# Patient Record
Sex: Male | Born: 1960
Health system: Southern US, Community
[De-identification: ages and names within clinical notes are randomized; demographics above are authoritative.]

## PROBLEM LIST (undated history)

## (undated) DIAGNOSIS — M109 Gout, unspecified: Secondary | ICD-10-CM

## (undated) HISTORY — DX: Gout, unspecified: M10.9

## (undated) HISTORY — PX: WISDOM TOOTH EXTRACTION: SHX21

## (undated) HISTORY — PX: HERNIA REPAIR: SHX51

---

## 2010-08-17 ENCOUNTER — Inpatient Hospital Stay (INDEPENDENT_AMBULATORY_CARE_PROVIDER_SITE_OTHER)
Admission: RE | Admit: 2010-08-17 | Discharge: 2010-08-17 | Disposition: A | Discharge status: Home / Self Care | Payer: BC Managed Care – PPO | Source: Ambulatory Visit | Attending: Emergency Medicine | Admitting: Emergency Medicine

## 2010-08-17 ENCOUNTER — Encounter: Payer: Self-pay | Admitting: Emergency Medicine

## 2010-08-17 DIAGNOSIS — R079 Chest pain, unspecified: Secondary | ICD-10-CM | POA: Insufficient documentation

## 2011-02-17 NOTE — Progress Notes (Signed)
Summary: RIB PAIN(rm4)   Vital Signs:  Patient Profile:   50 Years Old Male CC:      rib pain Height:     70 inches Weight:      148.8 pounds O2 Sat:      100 % O2 treatment:    Room Air Temp:     97.9 degrees F oral Pulse rate:   76 / minute Resp:     20 per minute BP sitting:   126 / 79  (left arm) Cuff size:   regular  Vitals Entered By: Linton Flemings RN (August 17, 2010 9:32 AM)                  Updated Prior Medication List: No Medications Current Allergies: No known allergies History of Present Illness History from: patient Chief Complaint: rib pain History of Present Illness: L sided rib pain for the last 3 weeks, worse with deep breaths and coughing.  His wife had pneumonia and he also got sick, and was Rx Zpak.  His URI symptoms have completely resolved but still with lower left side pain.  No CP, SOB,  DOE, orthopnea, dyspea, sweating, radiation of pain, dizziness, blurry vision.  REVIEW OF SYSTEMS Constitutional Symptoms      Denies fever, chills, night sweats, weight loss, weight gain, and fatigue.  Eyes       Denies change in vision, eye pain, eye discharge, glasses, contact lenses, and eye surgery. Ear/Nose/Throat/Mouth       Denies hearing loss/aids, change in hearing, ear pain, ear discharge, dizziness, frequent runny nose, frequent nose bleeds, sinus problems, sore throat, hoarseness, and tooth pain or bleeding.  Respiratory       Denies dry cough, productive cough, wheezing, shortness of breath, asthma, bronchitis, and emphysema/COPD.  Cardiovascular       Denies murmurs, chest pain, and tires easily with exhertion.    Gastrointestinal       Denies stomach pain, nausea/vomiting, diarrhea, constipation, blood in bowel movements, and indigestion. Genitourniary       Denies painful urination, kidney stones, and loss of urinary control. Neurological       Denies paralysis, seizures, and fainting/blackouts. Musculoskeletal       Denies muscle pain,  joint pain, joint stiffness, decreased range of motion, redness, swelling, muscle weakness, and gout.  Skin       Denies bruising, unusual mles/lumps or sores, and hair/skin or nail changes.  Psych       Denies mood changes, temper/anger issues, anxiety/stress, speech problems, depression, and sleep problems. Other Comments: left rib pain, states he had a virus a month ago and had alot of coughing. now having left rib pain and unable to take deep breathe at times   Past History:  Past Medical History: Unremarkable  Past Surgical History: double hernia  Family History: mother-CA Family History Diabetes 1st degree relative  Social History: smoke- yes alcohol-yes rec. drugs-no Physical Exam General appearance: well developed, well nourished, no acute distress Chest/Lungs: no rales, wheezes, or rhonchi bilateral, breath sounds equal without effort Heart: regular rate and  rhythm, no murmur MSE: oriented to time, place, and person AP and Lat squeezing not painful.  +TTP point tenderness lateral aspect chest wall around 10th rib.  No bruising or rash seen.   Assessment New Problems: RIB PAIN (ICD-786.50) FAMILY HISTORY DIABETES 1ST DEGREE RELATIVE (ICD-V18.0)   Plan New Orders: New Patient Level II [99202] Planning Comments:   Rib pain likely costochondritis, although also discussed  possibiltity of stress fracture of rib.  I think he should give it a little more time (1-2 more weeks), avoid coughing, consider mild NSAIDs OTC.  If any worsening, go to ER.  If not improving, need to consider Xray and rib belt.  Avoid strenuous pushing, pulling, lifting.  No meds given today.   The patient and/or caregiver has been counseled thoroughly with regard to medications prescribed including dosage, schedule, interactions, rationale for use, and possible side effects and they verbalize understanding.  Diagnoses and expected course of recovery discussed and will return if not improved as  expected or if the condition worsens. Patient and/or caregiver verbalized understanding.   Orders Added: 1)  New Patient Level II [99202]

## 2012-09-16 ENCOUNTER — Emergency Department (INDEPENDENT_AMBULATORY_CARE_PROVIDER_SITE_OTHER)
Admission: EM | Admit: 2012-09-16 | Discharge: 2012-09-16 | Disposition: A | Discharge status: Home / Self Care | Payer: BC Managed Care – PPO | Source: Home / Self Care | Attending: Family Medicine | Admitting: Family Medicine

## 2012-09-16 ENCOUNTER — Emergency Department (INDEPENDENT_AMBULATORY_CARE_PROVIDER_SITE_OTHER): Payer: BC Managed Care – PPO

## 2012-09-16 ENCOUNTER — Encounter: Payer: Self-pay | Admitting: *Deleted

## 2012-09-16 ENCOUNTER — Other Ambulatory Visit: Payer: Self-pay | Admitting: Family Medicine

## 2012-09-16 DIAGNOSIS — R05 Cough: Secondary | ICD-10-CM

## 2012-09-16 DIAGNOSIS — R059 Cough, unspecified: Secondary | ICD-10-CM

## 2012-09-16 DIAGNOSIS — J40 Bronchitis, not specified as acute or chronic: Secondary | ICD-10-CM

## 2012-09-16 DIAGNOSIS — R509 Fever, unspecified: Secondary | ICD-10-CM

## 2012-09-16 DIAGNOSIS — J069 Acute upper respiratory infection, unspecified: Secondary | ICD-10-CM

## 2012-09-16 LAB — POCT CBC W AUTO DIFF (K'VILLE URGENT CARE)

## 2012-09-16 MED ORDER — DOXYCYCLINE HYCLATE 100 MG PO CAPS: 100.0000 mg | ORAL_CAPSULE | Freq: Two times a day (BID) | ORAL | Status: AC

## 2012-09-16 NOTE — ED Notes (Signed)
Bobby Patterson c/o fever, body aches and dry cough x 3am today. Taken advil @ 3am.

## 2012-09-16 NOTE — ED Provider Notes (Signed)
History    CSN: [id number] Arrival date & time 09/16/12  1646  First MD Initiated Contact with Patient 09/16/12 1655     Chief Complaint  Patient presents with  . Fever  . Generalized Body Aches    HPI  URI Symptoms Onset: 1 day  Description: fever, generalized malaise, cough  Modifying factors:  1/4 PPD smoker   Symptoms Nasal discharge: yes Fever: yes Sore throat: n Cough: yes Wheezing: no Ear pain: no GI symptoms: no Sick contacts: unsure  Red Flags  Stiff neck: no Dyspnea: no Rash: no Swallowing difficulty: no  Sinusitis Risk Factors Headache/face pain: no Double sickening: no tooth pain: no  Allergy Risk Factors Sneezing: no Itchy scratchy throat: no Seasonal symptoms: no  Flu Risk Factors Headache: yes muscle aches: yes severe fatigue: mild    History reviewed. No pertinent past medical history. Past Surgical History  Procedure Laterality Date  . Hernia repair     Family History  Problem Relation Age of Onset  . Cancer Mother     ovarian  . Cancer Father     colon   History  Substance Use Topics  . Smoking status: Current Every Day Smoker -- 0.25 packs/day for 10 years    Types: Cigarettes  . Smokeless tobacco: Never Used  . Alcohol Use: Yes    Review of Systems  All other systems reviewed and are negative.    Allergies  Review of patient's allergies indicates no known allergies.  Home Medications  No current outpatient prescriptions on file. BP 117/86  Pulse 79  Temp(Src) 100.8 F (38.2 C) (Oral)  Resp 16  Ht 5\' 10"  (1.778 m)  Wt 150 lb (68.04 kg)  BMI 21.52 kg/m2  SpO2 98% Physical Exam  Constitutional: He appears well-developed and well-nourished.  HENT:  Head: Normocephalic and atraumatic.  Right Ear: External ear normal.  Left Ear: External ear normal.  +nasal erythema, rhinorrhea bilaterally, + post oropharyngeal erythema    Eyes: Conjunctivae are normal. Pupils are equal, round, and reactive to light.   Neck: Normal range of motion. Neck supple.  Cardiovascular: Normal rate, regular rhythm and normal heart sounds.   Pulmonary/Chest: Effort normal and breath sounds normal. No respiratory distress. He has no wheezes. He has no rales.  Abdominal: Soft.  Musculoskeletal: Normal range of motion.  Neurological: He is alert.  Skin: Skin is warm.    ED Course  Procedures (including critical care time) Labs Reviewed  INFLUENZA PANEL BY PCR  ROCKY MTN SPOTTED FVR AB, IGG-BLOOD  ROCKY MTN SPOTTED FVR AB, IGM-BLOOD  B. BURGDORFI ANTIBODIES  POCT CBC W AUTO DIFF ([facility])   Dg Chest 2 View  09/16/2012   *RADIOLOGY REPORT*  Clinical Data: fever, cough  CHEST - 2 VIEW  Comparison: None.  Findings: The cardiac and mediastinal silhouettes are within normal limits.  The lungs are well inflated.  No airspace consolidation to suggest acute infectious pneumonitis is identified. There is no pleural effusion or pulmonary edema.  No pneumothorax.  No acute osseous abnormality.  IMPRESSION: No acute cardiopulmonary process.   Original Report Authenticated By: Rise Mu, M.D.   1. URI (upper respiratory infection)   2. Bronchitis     MDM  Likely viral process. Given seasonal presentation will check patient for Lyme disease, RMSF, as well as flu. Patient given prophylactic prescription for doxycycline for upper respiratory as well as RMSF coverage. Discuss infectious and general red flags. Followup as needed.    The patient  and/or caregiver has been counseled thoroughly with regard to treatment plan and/or medications prescribed including dosage, schedule, interactions, rationale for use, and possible side effects and they verbalize understanding. Diagnoses and expected course of recovery discussed and will return if not improved as expected or if the condition worsens. Patient and/or caregiver verbalized understanding.       Doree Albee, MD 09/16/12 [phone]

## 2012-09-19 ENCOUNTER — Telehealth: Payer: Self-pay | Admitting: Emergency Medicine

## 2012-09-19 LAB — ROCKY MTN SPOTTED FVR AB, IGG-BLOOD: RMSF IgG: 0.18 IV

## 2012-09-19 LAB — INFLUENZA A & B PCR: Influenza B: NOT DETECTED

## 2012-09-20 LAB — B. BURGDORFI ANTIBODIES: B burgdorferi Ab IgG+IgM: 0.24 {ISR}

## 2014-05-16 ENCOUNTER — Emergency Department (INDEPENDENT_AMBULATORY_CARE_PROVIDER_SITE_OTHER)
Admission: EM | Admit: 2014-05-16 | Discharge: 2014-05-16 | Disposition: A | Discharge status: Home / Self Care | Payer: BLUE CROSS/BLUE SHIELD | Source: Home / Self Care | Attending: Emergency Medicine | Admitting: Emergency Medicine

## 2014-05-16 ENCOUNTER — Encounter: Payer: Self-pay | Admitting: *Deleted

## 2014-05-16 DIAGNOSIS — J111 Influenza due to unidentified influenza virus with other respiratory manifestations: Secondary | ICD-10-CM

## 2014-05-16 DIAGNOSIS — J1189 Influenza due to unidentified influenza virus with other manifestations: Secondary | ICD-10-CM

## 2014-05-16 MED ORDER — OSELTAMIVIR PHOSPHATE 75 MG PO CAPS: 75.0000 mg | ORAL_CAPSULE | Freq: Two times a day (BID) | ORAL | Status: DC

## 2014-05-16 NOTE — ED Provider Notes (Signed)
CSN: [id number]     Arrival date & time 05/16/14  0805 History   First MD Initiated Contact with Patient 05/16/14 [phone]     Chief Complaint  Patient presents with  . Fever  . Chills   (Consider location/radiation/quality/duration/timing/severity/associated sxs/prior Treatment) Patient is a 54 y.o. male presenting with fever. The history is provided by the patient. No language interpreter was used.  Fever Max temp prior to arrival:  100 Temp source:  Subjective Duration:  2 days Timing:  Constant Progression:  Worsening Chronicity:  New Relieved by:  Nothing Worsened by:  Nothing tried Ineffective treatments:  None tried Associated symptoms: cough, myalgias, rhinorrhea and sore throat   Risk factors: no sick contacts     History reviewed. No pertinent past medical history. Past Surgical History  Procedure Laterality Date  . Hernia repair    . Wisdom tooth extraction     Family History  Problem Relation Age of Onset  . Cancer Mother     ovarian  . Cancer Father     colon   History  Substance Use Topics  . Smoking status: Current Every Day Smoker -- 0.25 packs/day for 10 years    Types: Cigarettes  . Smokeless tobacco: Never Used  . Alcohol Use: Yes    Review of Systems  Constitutional: Positive for fever.  HENT: Positive for rhinorrhea and sore throat.   Respiratory: Positive for cough.   Musculoskeletal: Positive for myalgias.  All other systems reviewed and are negative.   Allergies  Review of patient's allergies indicates no known allergies.  Home Medications   Prior to Admission medications   Medication Sig Start Date End Date Taking? Authorizing Provider  oseltamivir (TAMIFLU) 75 MG capsule Take 1 capsule (75 mg total) by mouth every 12 (twelve) hours. 05/16/14   Elson AreasLeslie K Keats Kingry, PA-C   BP 128/75 mmHg  Pulse 86  Temp(Src) 99.4 F (37.4 C) (Oral)  Resp 18  Ht 5\' 10"  (1.778 m)  Wt 150 lb (68.04 kg)  BMI 21.52 kg/m2  SpO2 98% Physical Exam   Constitutional: He is oriented to person, place, and time. He appears well-developed and well-nourished.  HENT:  Head: Normocephalic.  Right Ear: External ear normal.  Left Ear: External ear normal.  Nose: Nose normal.  Mouth/Throat: Oropharynx is clear and moist.  Eyes: Conjunctivae and EOM are normal. Pupils are equal, round, and reactive to light.  Neck: Normal range of motion.  Cardiovascular: Normal rate.   Pulmonary/Chest: Effort normal.  Abdominal: Soft. He exhibits no distension.  Musculoskeletal: Normal range of motion.  Neurological: He is alert and oriented to person, place, and time.  Psychiatric: He has a normal mood and affect.  Nursing note and vitals reviewed.   ED Course  Procedures (including critical care time) Labs Review Labs Reviewed - No data to display  Imaging Review No results found.   MDM   1. Influenza with respiratory manifestations    tamiflu ibuprofen AVS   Elson AreasLeslie K Darshay Deupree, PA-C 05/16/14 0839  Elson AreasLeslie K Varnika Butz, PA-C 05/16/14 [phone]

## 2014-05-16 NOTE — Discharge Instructions (Signed)

## 2014-05-16 NOTE — ED Notes (Signed)
Pt c/o chills, fever, sweats, body aches and dry cough x 1 day. He took Theraflu last night at 10pm.

## 2016-03-20 ENCOUNTER — Emergency Department (INDEPENDENT_AMBULATORY_CARE_PROVIDER_SITE_OTHER): Payer: 59

## 2016-03-20 ENCOUNTER — Encounter: Payer: Self-pay | Admitting: *Deleted

## 2016-03-20 ENCOUNTER — Emergency Department (INDEPENDENT_AMBULATORY_CARE_PROVIDER_SITE_OTHER)
Admission: EM | Admit: 2016-03-20 | Discharge: 2016-03-20 | Disposition: A | Discharge status: Home / Self Care | Payer: 59 | Source: Home / Self Care | Attending: Family Medicine | Admitting: Family Medicine

## 2016-03-20 ENCOUNTER — Telehealth (INDEPENDENT_AMBULATORY_CARE_PROVIDER_SITE_OTHER): Payer: 59 | Admitting: Emergency Medicine

## 2016-03-20 DIAGNOSIS — K314 Gastric diverticulum: Secondary | ICD-10-CM | POA: Diagnosis not present

## 2016-03-20 DIAGNOSIS — R6881 Early satiety: Secondary | ICD-10-CM

## 2016-03-20 DIAGNOSIS — K760 Fatty (change of) liver, not elsewhere classified: Secondary | ICD-10-CM | POA: Diagnosis not present

## 2016-03-20 DIAGNOSIS — Z800 Family history of malignant neoplasm of digestive organs: Secondary | ICD-10-CM

## 2016-03-20 DIAGNOSIS — R195 Other fecal abnormalities: Secondary | ICD-10-CM

## 2016-03-20 LAB — POCT CBC W AUTO DIFF (K'VILLE URGENT CARE)

## 2016-03-20 LAB — COMPLETE METABOLIC PANEL WITH GFR
ALT: 20 U/L (ref 9–46)
AST: 19 U/L (ref 10–35)
Albumin: 4.2 g/dL (ref 3.6–5.1)
Alkaline Phosphatase: 38 U/L — ABNORMAL LOW (ref 40–115)
BUN: 13 mg/dL (ref 7–25)
CO2: 23 mmol/L (ref 20–31)
Calcium: 9 mg/dL (ref 8.6–10.3)
Chloride: 102 mmol/L (ref 98–110)
Creat: 0.83 mg/dL (ref 0.70–1.33)
GFR, Est African American: >89 mL/min (ref >=60)
GFR, Est Non African American: >89 mL/min (ref >=60)
Glucose, Bld: 86 mg/dL (ref 65–99)
Potassium: 4.4 mmol/L (ref 3.5–5.3)
Sodium: 137 mmol/L (ref 135–146)
Total Bilirubin: 0.6 mg/dL (ref 0.2–1.2)
Total Protein: 6.4 g/dL (ref 6.1–8.1)

## 2016-03-20 MED ORDER — IOPAMIDOL (ISOVUE-300) INJECTION 61%
100.0000 mL | Freq: Once | INTRAVENOUS | Status: AC | PRN
  Administered 2016-03-20: 100 mL via INTRAVENOUS

## 2016-03-20 NOTE — ED Provider Notes (Signed)
CSN: [id number]     Arrival date & time 03/20/16  0854 History   First MD Initiated Contact with Patient 03/20/16 [phone]     Chief Complaint  Patient presents with  . GI Problem   (Consider location/radiation/quality/duration/timing/severity/associated sxs/prior Treatment) HPI  Bobby Patterson is a 56 y.o. male presenting to UC with c/o early satiety, excessive fullness and bloating after eating, and change in bowel movements as his stools have been soft and stringy/thin since Thanksgiving about 5-6 weeks ago.  He denies change in diet. Denies blood in stool, nausea, vomiting or weight change. Pt is concerned as his father had colon cancer so he does not want to wait any longer to be evaluated, however, he does not have a PCP and was not able to get in until March 2018.  Hx of hernia repair as a young child but no other abdominal surgeries.     History reviewed. No pertinent past medical history. Past Surgical History:  Procedure Laterality Date  . HERNIA REPAIR    . WISDOM TOOTH EXTRACTION     Family History  Problem Relation Age of Onset  . Cancer Mother     ovarian  . Cancer Father     colon   Social History  Substance Use Topics  . Smoking status: Current Every Day Smoker    Packs/day: 0.25    Years: 10.00    Types: Cigarettes  . Smokeless tobacco: Never Used  . Alcohol use Yes    Review of Systems  Constitutional: Positive for appetite change. Negative for chills, fever and unexpected weight change.  HENT: Negative for congestion, ear pain, sore throat, trouble swallowing and voice change.   Respiratory: Negative for cough and shortness of breath.   Cardiovascular: Negative for chest pain and palpitations.  Gastrointestinal: Positive for abdominal pain ( bloating, discomfort) and constipation ( thin stool, don't feel its all out). Negative for blood in stool, diarrhea, nausea and vomiting.  Genitourinary: Negative for dysuria, frequency, hematuria and urgency.   Musculoskeletal: Negative for arthralgias, back pain and myalgias.  Skin: Negative for rash.  All other systems reviewed and are negative.   Allergies  Patient has no known allergies.  Home Medications   Prior to Admission medications   Not on File   Meds Ordered and Administered this Visit  Medications - No data to display  BP 133/82 (BP Location: Left Arm)   Pulse 70   Temp 97.5 F (36.4 C) (Oral)   Wt 154 lb (69.9 kg)   SpO2 99%   BMI 22.10 kg/m  No data found.   Physical Exam  Constitutional: He is oriented to person, place, and time. He appears well-developed and well-nourished. No distress.  HENT:  Head: Normocephalic and atraumatic.  Nose: Nose normal.  Mouth/Throat: Uvula is midline, oropharynx is clear and moist and mucous membranes are normal.  Eyes: EOM are normal.  Neck: Normal range of motion. Neck supple.  Cardiovascular: Normal rate and regular rhythm.   Pulmonary/Chest: Effort normal and breath sounds normal. No respiratory distress. He has no wheezes. He has no rales.  Abdominal: Soft. Bowel sounds are normal. He exhibits no distension and no mass. There is no tenderness. There is no rebound and no guarding. No hernia.  Musculoskeletal: Normal range of motion.  Neurological: He is alert and oriented to person, place, and time.  Skin: Skin is warm and dry. He is not diaphoretic.  Psychiatric: He has a normal mood and affect. His behavior is normal.  Nursing note and vitals reviewed.   Urgent Care Course   Clinical Course     Procedures (including critical care time)  Labs Review Labs Reviewed  COMPLETE METABOLIC PANEL WITH GFR  LIPASE  AMYLASE  POCT CBC W AUTO DIFF ([facility])    Imaging Review Ct Abdomen Pelvis W Contrast  Result Date: 03/20/2016 CLINICAL DATA:  Abdominal pain/pain with defecation. Developing constipation. EXAM: CT ABDOMEN AND PELVIS WITH CONTRAST TECHNIQUE: Multidetector CT imaging of the abdomen and pelvis  was performed using the standard protocol following bolus administration of intravenous contrast. Oral contrast was also administered. CONTRAST:  ISOVUE-300 IOPAMIDOL (ISOVUE-300) INJECTION 61% COMPARISON:  None. FINDINGS: Lower chest: Lung bases are clear. Hepatobiliary: There is a degree of hepatic steatosis. No focal liver lesions are evident. Gallbladder wall is not appreciably thickened. There is no biliary duct dilatation. Pancreas: No pancreatic mass or inflammatory focus. Spleen: No splenic lesions are evident. Adrenals/Urinary Tract: Adrenals appear normal bilaterally. There are several small cysts in each kidney. The largest cyst on the right is in the upper pole region measuring 1.0 x 0.9 cm. The largest cyst on the left is in the lower pole region posteriorly measuring 1.7 x 1.4 cm. There is no hydronephrosis on either side. There is no renal or ureteral calculus on either side. Urinary bladder is midline with wall thickness within normal limits. Stomach/Bowel: There are scattered sigmoid diverticula without diverticulitis. There is stool throughout much of the colon. There is no appreciable bowel wall or mesenteric thickening. No appreciable colonic narrowing is evident by CT. There is no bowel obstruction. No free air or portal venous air. Vascular/Lymphatic: There is no abdominal aortic aneurysm. No vascular lesions are evident. There is no adenopathy in the abdomen or pelvis. Reproductive: Prostate and seminal vesicles appear normal in size and contour. There is minimal calcification in the prostate. There is no pelvic mass or pelvic fluid collection. Other: Appendix appears unremarkable. There is no ascites or abscess in the abdomen or pelvis. Musculoskeletal: There are no blastic or lytic bone lesions. No intramuscular or abdominal wall lesion. IMPRESSION: Scattered sigmoid diverticula without diverticulitis. No bowel obstruction. No abscess. No bowel wall thickening. Note that stool  throughout the colon could easily obscure a colonic lesion. Given patient's symptoms, direct visualization of the colon may well be advisable. Hepatic steatosis.  No focal liver lesions evident. No renal or ureteral calculus.  No hydronephrosis. Appendix normal. Electronically Signed   By: Bretta Bang III M.D.   On: 03/20/2016 10:48     MDM   1. Early satiety   2. Loose stools   3. Family history of colon cancer in father    Pt c/o 5-6 weeks of GI symptoms. Hx of colon cancer in his father. Pt requesting imaging to r/o cancer as he cannot f/u with a PCP until March and he is afraid to wait until then.  CT abd: diverticula w/o diverticulitis. No bowel obstruction or abscess.  Recommend f/u colonoscopy for further evaluation of symptoms and potential colonic lesion.   Primary Care f/u scheduled for Wednesday, 03/26/16 at [facility]  Resource info for [facility] provided to pt to schedule f/u appointment for colonoscopy. Patient verbalized understanding and agreement with treatment plan.     Junius Finner, PA-C 03/20/16 1326

## 2016-03-20 NOTE — ED Triage Notes (Signed)
Patient c/o 5-6 weeks of excessive fullness after eating and change on bowel movement. Stools are thin and long. Denies changes in drinking and eating habits, denies blood in stool or nausea.

## 2016-03-21 LAB — LIPASE: Lipase: 16 U/L (ref 7–60)

## 2016-03-21 LAB — AMYLASE: Amylase: 35 U/L (ref 0–105)

## 2016-03-22 ENCOUNTER — Telehealth: Payer: Self-pay | Admitting: *Deleted

## 2016-03-22 NOTE — Telephone Encounter (Signed)
Callback: LMOM f/u from visit. Call back as needed. Follow up with family med as scheduled.

## 2016-03-26 ENCOUNTER — Ambulatory Visit (INDEPENDENT_AMBULATORY_CARE_PROVIDER_SITE_OTHER): Payer: 59 | Admitting: Physician Assistant

## 2016-03-26 ENCOUNTER — Encounter: Payer: Self-pay | Admitting: Physician Assistant

## 2016-03-26 VITALS — BP 133/83 | HR 69 | Ht 70.0 in | Wt 158.0 lb

## 2016-03-26 DIAGNOSIS — K5900 Constipation, unspecified: Secondary | ICD-10-CM

## 2016-03-26 DIAGNOSIS — Z0000 Encounter for general adult medical examination without abnormal findings: Secondary | ICD-10-CM

## 2016-03-26 DIAGNOSIS — F17200 Nicotine dependence, unspecified, uncomplicated: Secondary | ICD-10-CM | POA: Insufficient documentation

## 2016-03-26 NOTE — Patient Instructions (Signed)

## 2016-03-26 NOTE — Progress Notes (Signed)
HPI:                                                                Bobby Patterson is a 56 y.o. male who presents to [facility]: Primary Care Sports Medicine today to establish care  Patient complains of constipation x 6 weeks. Patient endorses abdominal bloating and early satiety. States his stools are "thinner/stringy." He denies change in diet or medications. He was seen in urgent care on 03/20/14 and had a CT Abd/Pel that showed a stool burden. Colonoscopy was recommended.   Patient is a current smoker with 10 packyear history.  Family hx significant for diabetes, stroke and colon cancer in father, ovarian cancer in mother, and diabetes paternal grandfather.  Influenza declined Tdap declined  Health Maintenance Health Maintenance  Topic Date Due  . Hepatitis C Screening  [birth year]  . HIV Screening  05/26/1977  . TETANUS/TDAP  05/26/1981  . COLONOSCOPY  05/26/2012  . INFLUENZA VACCINE  10/16/2015   Health Habits  Diet: sodas, fried food for lunch, cooks dinner at home, 1 bottle of water per day  Exercise:  ETOH: yes  Tobacco: yes  Drugs: no  Dental Exam: up to date  Eye Exam: up to date  Past Medical History:  Diagnosis Date  . Gout    Past Surgical History:  Procedure Laterality Date  . HERNIA REPAIR    . WISDOM TOOTH EXTRACTION     Social History  Substance Use Topics  . Smoking status: Current Every Day Smoker    Packs/day: 0.25    Years: 10.00    Types: Cigarettes  . Smokeless tobacco: Never Used  . Alcohol use Yes   family history includes Cancer in his father and mother; Diabetes in his father and paternal grandfather; Stroke in his father.  Review of Systems  Constitutional: Negative for chills, fever, malaise/fatigue and weight loss.  HENT: Negative.   Eyes: Positive for blurred vision (wears corrective lenses).  Respiratory: Negative.   Cardiovascular: Negative.   Gastrointestinal: Positive for constipation. Negative  for abdominal pain, blood in stool and nausea.       Early satiety, bloating  Genitourinary: Negative.   Musculoskeletal: Negative.   Skin: Negative for rash.  Neurological: Negative.   Psychiatric/Behavioral: Negative.      Medications: No current outpatient prescriptions on file.   No current facility-administered medications for this visit.    No Known Allergies     Objective:  BP 133/83   Pulse 69   Ht 5\' 10"  (1.778 m)   Wt 158 lb (71.7 kg)   BMI 22.67 kg/m  Gen: well-groomed, cooperative, not ill-appearing, no distress HEENT: normal conjunctiva, TM's clear, moderate amount of cerumen in left canal, oropharynx clear, moist mucus membranes, no thyromegaly or tenderness Lungs: Normal work of breathing, clear to auscultation bilaterally Heart: Normal rate, regular rhythm, s1 and s2 distinct, no murmurs, clicks or rubs appreciated on this exam, no carotid bruit Abd: Soft. Nondistended, Nontender Neuro: alert and oriented x 3, EOM's intact, PERRLA, DTR's intact MSK: strength 5/5 and symmetric, normal gait Extremities: distal pulses intact, no peripheral edema Skin: warm and dry, no rashes or lesions on exposed skin Psych: normal affect, pleasant mood, normal speech and thought content   No results found  for this or any previous visit (from the past 72 hour(s)). No results found.    Assessment and Plan: 56 y.o. male with   1. Encounter for preventative adult health care examination - Hemoglobin A1c - Lipid panel - Hepatitis C antibody - CBC  2. Constipation, unspecified constipation type - Colace and Miralax daily - increase water intake  - avoid processed foods and increase fresh foods - follow-up with GI for colonoscopy  Patient education and anticipatory guidance given Patient agrees with treatment plan Follow-up in 1 year or sooner as needed  Levonne Hubertharley E. Evertte Sones PA-C

## 2016-04-18 ENCOUNTER — Encounter: Payer: Self-pay | Admitting: Physician Assistant

## 2016-04-18 DIAGNOSIS — Z86010 Personal history of colonic polyps: Secondary | ICD-10-CM | POA: Insufficient documentation

## 2016-04-18 DIAGNOSIS — K5790 Diverticulosis of intestine, part unspecified, without perforation or abscess without bleeding: Secondary | ICD-10-CM | POA: Insufficient documentation

## 2017-09-29 IMAGING — CT CT ABD-PELV W/ CM
2 of 5 series · 15 of 46 positions shown, 17 images · IV contrast (iopamidol)
Comparison: None.

CLINICAL DATA: Abdominal pain/pain with defecation. Developing
constipation.

EXAM:
CT ABDOMEN AND PELVIS WITH CONTRAST
TECHNIQUE: Multidetector CT imaging of the abdomen and pelvis was performed
using the standard protocol following bolus administration of
intravenous contrast. Oral contrast was also administered.
CONTRAST:  100mL Y4XZA4-ZEE IOPAMIDOL (Y4XZA4-ZEE) INJECTION 61%

[Series 2: axial st · axial · 0.76mm/px · z∈[-455,-50]mm · 12 of 93 slices shown, 14 images]
[im 6/93  soft-tissue]
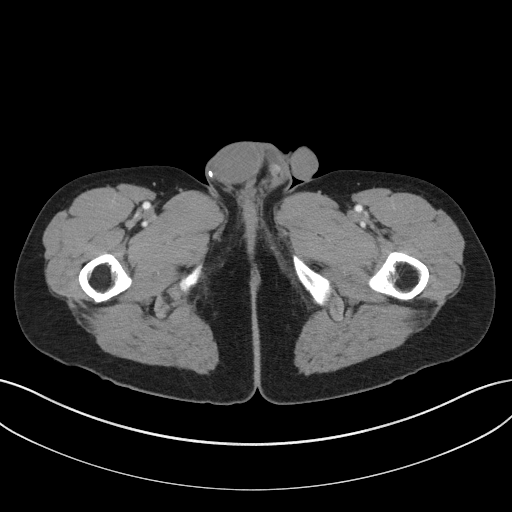
[im 6/93  bone]
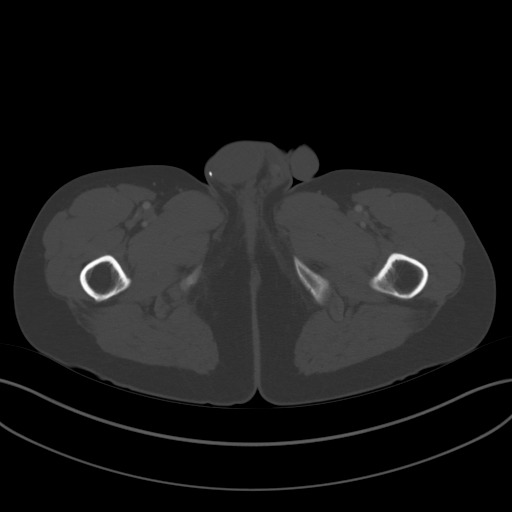
[im 16/93  soft-tissue]
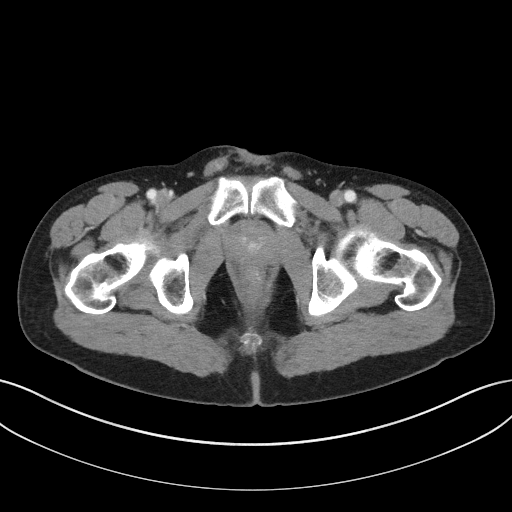
[im 21/93  soft-tissue]
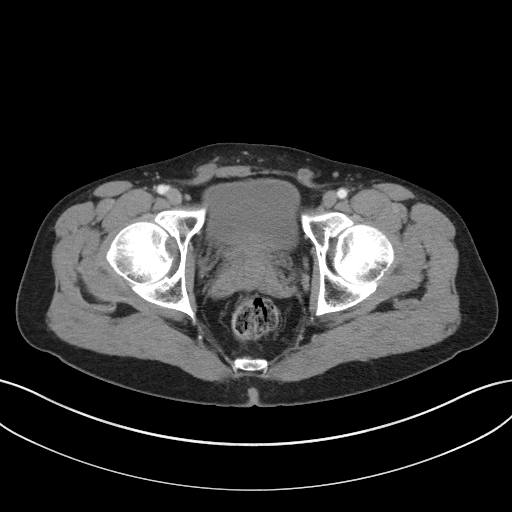
[im 26/93  soft-tissue]
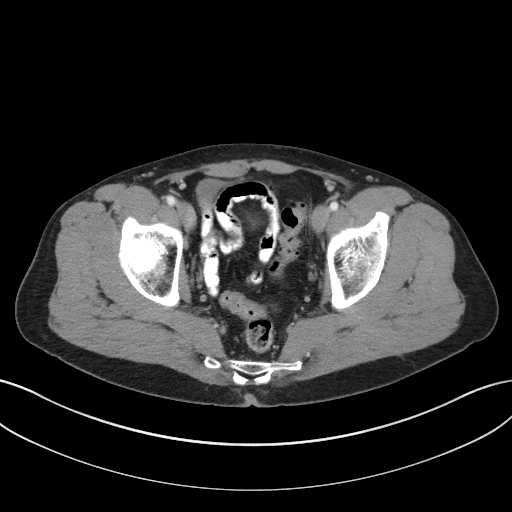
[im 36/93  soft-tissue]
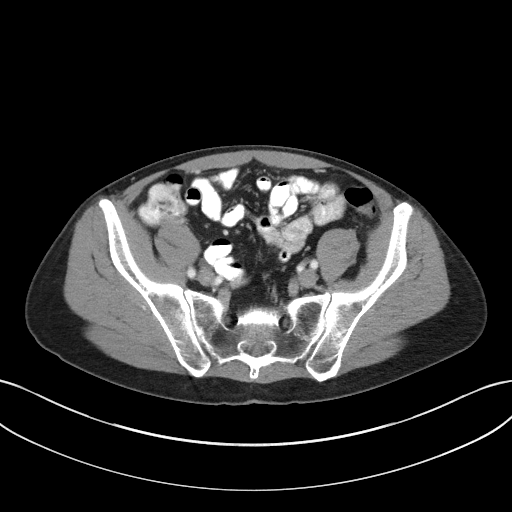
[im 41/93  soft-tissue]
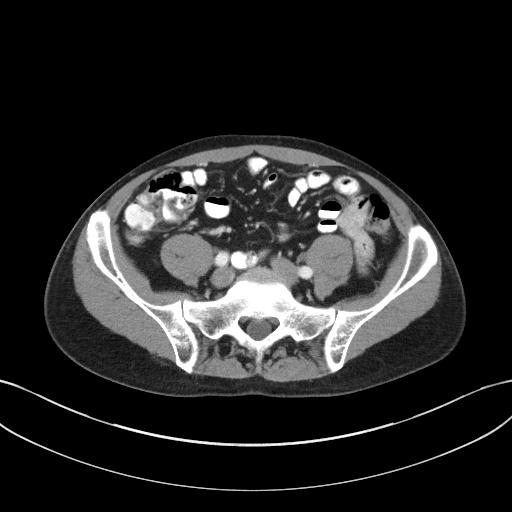
[im 52/93  soft-tissue]
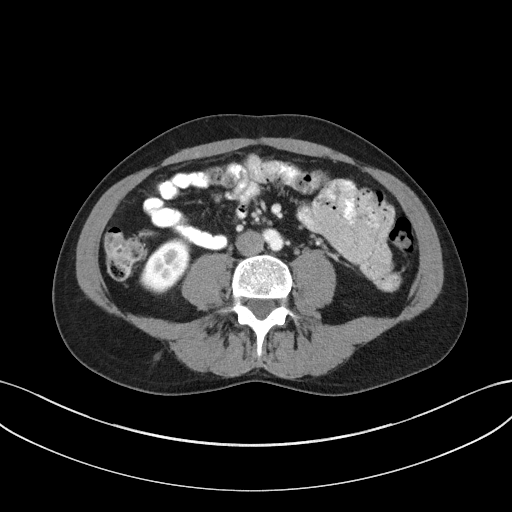
[im 57/93  soft-tissue]
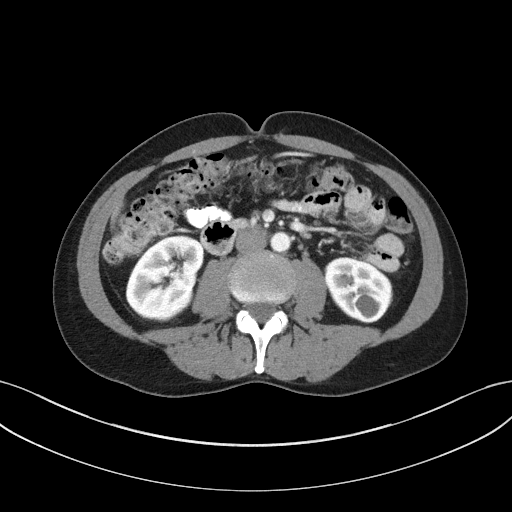
[im 67/93  soft-tissue]
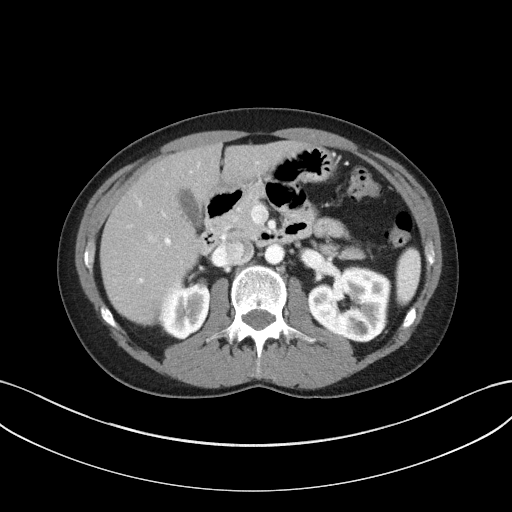
[im 67/93  bone]
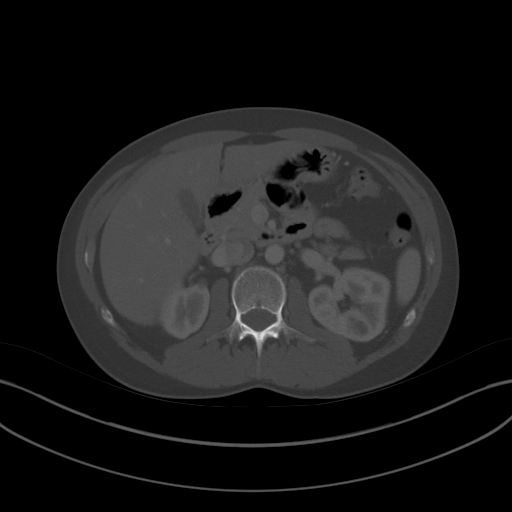
[im 72/93  soft-tissue]
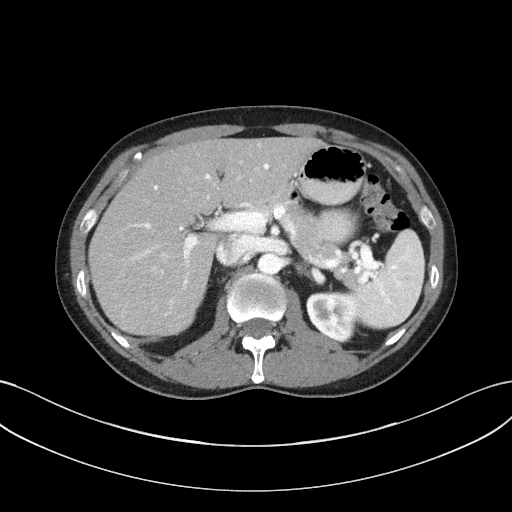
[im 77/93  soft-tissue]
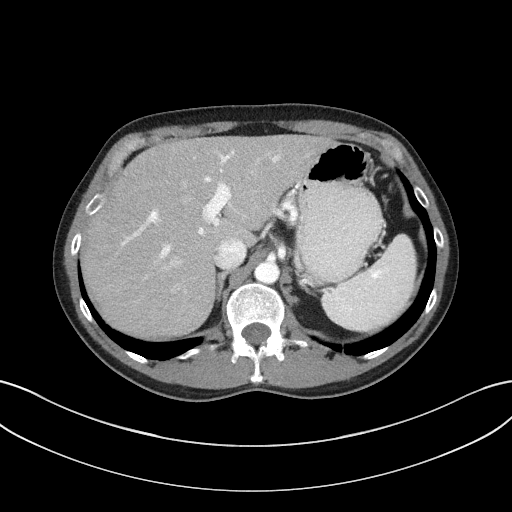
[im 87/93  soft-tissue]
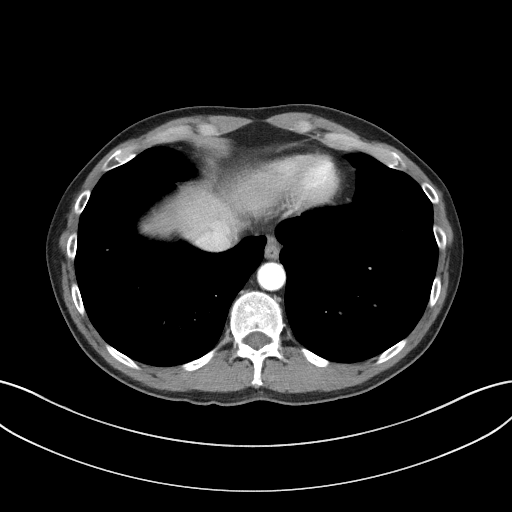

[Series 5: coronal st · coronal · 0.69mm/px · 3 of 78 slices shown]
[im 26/78  soft-tissue]
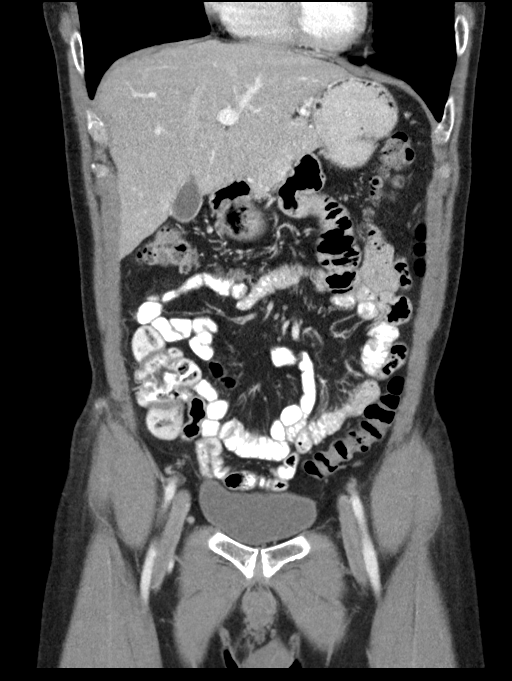
[im 35/78  soft-tissue]
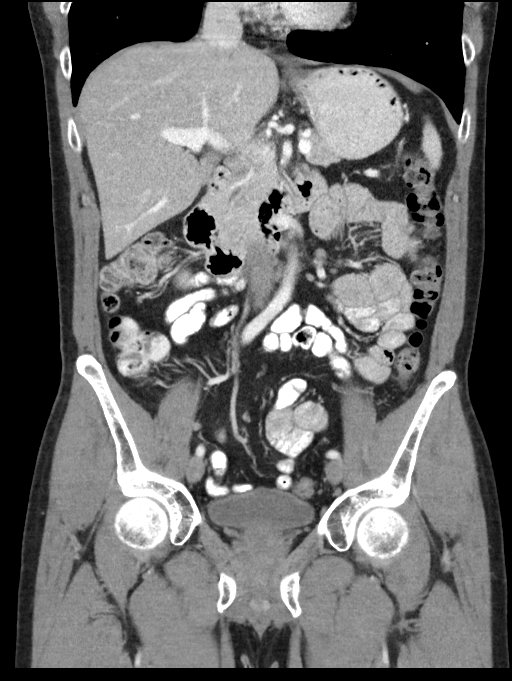
[im 43/78  soft-tissue]
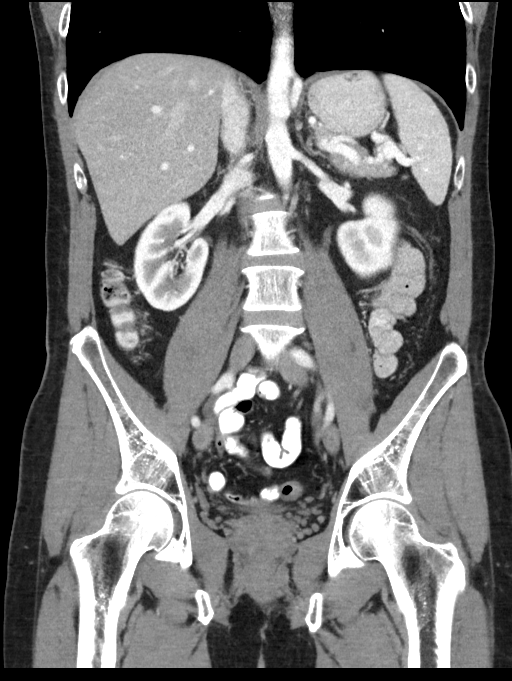

[15 of 46 positions shown; findings below may reference images not displayed]

FINDINGS: Lower chest: Lung bases are clear.

Hepatobiliary: There is a degree of hepatic steatosis. No focal
liver lesions are evident. Gallbladder wall is not appreciably
thickened. There is no biliary duct dilatation.

Pancreas: No pancreatic mass or inflammatory focus.

Spleen: No splenic lesions are evident.

Adrenals/Urinary Tract: Adrenals appear normal bilaterally. There
are several small cysts in each kidney. The largest cyst on the
right is in the upper pole region measuring 1.0 x 0.9 cm. The
largest cyst on the left is in the lower pole region posteriorly
measuring 1.7 x 1.4 cm. There is no hydronephrosis on either side.
There is no renal or ureteral calculus on either side. Urinary
bladder is midline with wall thickness within normal limits.

Stomach/Bowel: There are scattered sigmoid diverticula without
diverticulitis. There is stool throughout much of the colon. There
is no appreciable bowel wall or mesenteric thickening. No
appreciable colonic narrowing is evident by CT. There is no bowel
obstruction. No free air or portal venous air.

Vascular/Lymphatic: There is no abdominal aortic aneurysm. No
vascular lesions are evident. There is no adenopathy in the abdomen
or pelvis.

Reproductive: Prostate and seminal vesicles appear normal in size
and contour. There is minimal calcification in the prostate. There
is no pelvic mass or pelvic fluid collection.

Other: Appendix appears unremarkable. There is no ascites or abscess
in the abdomen or pelvis.

Musculoskeletal: There are no blastic or lytic bone lesions. No
intramuscular or abdominal wall lesion.
IMPRESSION: Scattered sigmoid diverticula without diverticulitis. No bowel
obstruction. No abscess. No bowel wall thickening. Note that stool
throughout the colon could easily obscure a colonic lesion. Given
patient's symptoms, direct visualization of the colon may well be
advisable.

Hepatic steatosis.  No focal liver lesions evident.

No renal or ureteral calculus.  No hydronephrosis.

Appendix normal.

## 2019-05-03 LAB — HM COLONOSCOPY

## 2020-01-16 ENCOUNTER — Encounter: Payer: Self-pay | Admitting: Family Medicine

## 2020-01-16 ENCOUNTER — Ambulatory Visit (INDEPENDENT_AMBULATORY_CARE_PROVIDER_SITE_OTHER): Payer: BC Managed Care – PPO | Admitting: Family Medicine

## 2020-01-16 ENCOUNTER — Other Ambulatory Visit: Payer: Self-pay

## 2020-01-16 VITALS — BP 134/84 | HR 78 | Temp 97.9°F | Ht 69.69 in | Wt 144.4 lb

## 2020-01-16 DIAGNOSIS — Z13220 Encounter for screening for lipoid disorders: Secondary | ICD-10-CM | POA: Diagnosis not present

## 2020-01-16 DIAGNOSIS — Z0000 Encounter for general adult medical examination without abnormal findings: Secondary | ICD-10-CM | POA: Diagnosis not present

## 2020-01-16 DIAGNOSIS — Z125 Encounter for screening for malignant neoplasm of prostate: Secondary | ICD-10-CM

## 2020-01-16 NOTE — Patient Instructions (Signed)
Preventive Care 41-57 Years Old, Male Preventive care refers to lifestyle choices and visits with your health care provider that can promote health and wellness. This includes:  A yearly physical exam. This is also called an annual well check.  Regular dental and eye exams.  Immunizations.  Screening for certain conditions.  Healthy lifestyle choices, such as eating a healthy diet, getting regular exercise, not using drugs or products that contain nicotine and tobacco, and limiting alcohol use. What can I expect for my preventive care visit? Physical exam Your health care provider will check:  Height and weight. These may be used to calculate body mass index (BMI), which is a measurement that tells if you are at a healthy weight.  Heart rate and blood pressure.  Your skin for abnormal spots. Counseling Your health care provider may ask you questions about:  Alcohol, tobacco, and drug use.  Emotional well-being.  Home and relationship well-being.  Sexual activity.  Eating habits.  Work and work Statistician. What immunizations do I need?  Influenza (flu) vaccine  This is recommended every year. Tetanus, diphtheria, and pertussis (Tdap) vaccine  You may need a Td booster every 10 years. Varicella (chickenpox) vaccine  You may need this vaccine if you have not already been vaccinated. Zoster (shingles) vaccine  You may need this after age 64. Measles, mumps, and rubella (MMR) vaccine  You may need at least one dose of MMR if you were born in 1957 or later. You may also need a second dose. Pneumococcal conjugate (PCV13) vaccine  You may need this if you have certain conditions and were not previously vaccinated. Pneumococcal polysaccharide (PPSV23) vaccine  You may need one or two doses if you smoke cigarettes or if you have certain conditions. Meningococcal conjugate (MenACWY) vaccine  You may need this if you have certain conditions. Hepatitis A  vaccine  You may need this if you have certain conditions or if you travel or work in places where you may be exposed to hepatitis A. Hepatitis B vaccine  You may need this if you have certain conditions or if you travel or work in places where you may be exposed to hepatitis B. Haemophilus influenzae type b (Hib) vaccine  You may need this if you have certain risk factors. Human papillomavirus (HPV) vaccine  If recommended by your health care provider, you may need three doses over 6 months. You may receive vaccines as individual doses or as more than one vaccine together in one shot (combination vaccines). Talk with your health care provider about the risks and benefits of combination vaccines. What tests do I need? Blood tests  Lipid and cholesterol levels. These may be checked every 5 years, or more frequently if you are over 60 years old.  Hepatitis C test.  Hepatitis B test. Screening  Lung cancer screening. You may have this screening every year starting at age 43 if you have a 30-pack-year history of smoking and currently smoke or have quit within the past 15 years.  Prostate cancer screening. Recommendations will vary depending on your family history and other risks.  Colorectal cancer screening. All adults should have this screening starting at age 72 and continuing until age 2. Your health care provider may recommend screening at age 14 if you are at increased risk. You will have tests every 1-10 years, depending on your results and the type of screening test.  Diabetes screening. This is done by checking your blood sugar (glucose) after you have not eaten  for a while (fasting). You may have this done every 1-3 years.  Sexually transmitted disease (STD) testing. Follow these instructions at home: Eating and drinking  Eat a diet that includes fresh fruits and vegetables, whole grains, lean protein, and low-fat dairy products.  Take vitamin and mineral supplements as  recommended by your health care provider.  Do not drink alcohol if your health care provider tells you not to drink.  If you drink alcohol: ? Limit how much you have to 0-2 drinks a day. ? Be aware of how much alcohol is in your drink. In the [place] one drink equals one 12 oz bottle of beer (355 mL), one 5 oz glass of wine (148 mL), or one 1 oz glass of hard liquor (44 mL). Lifestyle  Take daily care of your teeth and gums.  Stay active. Exercise for at least 30 minutes on 5 or more days each week.  Do not use any products that contain nicotine or tobacco, such as cigarettes, e-cigarettes, and chewing tobacco. If you need help quitting, ask your health care provider.  If you are sexually active, practice safe sex. Use a condom or other form of protection to prevent STIs (sexually transmitted infections).  Talk with your health care provider about taking a low-dose aspirin every day starting at age 53. What's next?  Go to your health care provider once a year for a well check visit.  Ask your health care provider how often you should have your eyes and teeth checked.  Stay up to date on all vaccines. This information is not intended to replace advice given to you by your health care provider. Make sure you discuss any questions you have with your health care provider. Document Revised: 02/25/2018 Document Reviewed: 02/25/2018 Elsevier Patient Education  2020 [employer].

## 2020-01-16 NOTE — Progress Notes (Signed)
Bobby Patterson - 59 y.o. male MRN [id number]  Date of birth: [birth year]  Subjective No chief complaint on file.   HPI Bobby Patterson is a 59 y.o. male here today for annual exam.  He has not been seen in our clinic in a few years.  He has been in relatively good health and denies any long term health problems.    He is a former smoker.  He consumes alcohol occasionally.   He stays pretty active and feels like his diet is pretty healthy.   He is UTD on colon cancer screening.    He would like to have updated labs completed today.   He had COVID vaccine. Declines flu vaccine.    Review of Systems  Constitutional: Negative for chills, fever, malaise/fatigue and weight loss.  HENT: Negative for congestion, ear pain and sore throat.   Eyes: Negative for blurred vision, double vision and pain.  Respiratory: Negative for cough and shortness of breath.   Cardiovascular: Negative for chest pain and palpitations.  Gastrointestinal: Negative for abdominal pain, blood in stool, constipation, heartburn and nausea.  Genitourinary: Negative for dysuria and urgency.  Musculoskeletal: Negative for joint pain and myalgias.  Neurological: Negative for dizziness and headaches.  Endo/Heme/Allergies: Does not bruise/bleed easily.  Psychiatric/Behavioral: Negative for depression. The patient is not nervous/anxious and does not have insomnia.      No Known Allergies  Past Medical History:  Diagnosis Date  . Gout     Past Surgical History:  Procedure Laterality Date  . HERNIA REPAIR    . WISDOM TOOTH EXTRACTION      Social History   Socioeconomic History  . Marital status: Single    Spouse name: Not on file  . Number of children: Not on file  . Years of education: Not on file  . Highest education level: Not on file  Occupational History  . Not on file  Tobacco Use  . Smoking status: Current Every Day Smoker    Packs/day: 0.25    Years: 10.00    Pack years: 2.50    Types:  Cigarettes  . Smokeless tobacco: Never Used  Substance and Sexual Activity  . Alcohol use: Yes  . Drug use: No  . Sexual activity: Yes  Other Topics Concern  . Not on file  Social History Narrative  . Not on file   Social Determinants of Health   Financial Resource Strain:   . Difficulty of Paying Living Expenses: Not on file  Food Insecurity:   . Worried About Programme researcher, broadcasting/film/video in the Last Year: Not on file  . Ran Out of Food in the Last Year: Not on file  Transportation Needs:   . Lack of Transportation (Medical): Not on file  . Lack of Transportation (Non-Medical): Not on file  Physical Activity:   . Days of Exercise per Week: Not on file  . Minutes of Exercise per Session: Not on file  Stress:   . Feeling of Stress : Not on file  Social Connections:   . Frequency of Communication with Friends and Family: Not on file  . Frequency of Social Gatherings with Friends and Family: Not on file  . Attends Religious Services: Not on file  . Active Member of Clubs or Organizations: Not on file  . Attends Banker Meetings: Not on file  . Marital Status: Not on file    Family History  Problem Relation Age of Onset  . Cancer Mother  ovarian  . Cancer Father        colon  . Stroke Father   . Diabetes Father   . Diabetes Paternal Grandfather     Health Maintenance  Topic Date Due  . Hepatitis C Screening  Never done  . HIV Screening  Never done  . TETANUS/TDAP  Never done  . INFLUENZA VACCINE  Never done  . COLONOSCOPY  04/17/2026  . COVID-19 Vaccine  Completed     ----------------------------------------------------------------------------------------------------------------------------------------------------------------------------------------------------------------- Physical Exam BP 134/84 (BP Location: Left Arm, Patient Position: Sitting, Cuff Size: Normal)   Pulse 78   Temp 97.9 F (36.6 C)   Ht 5' 9.69" (1.77 m)   Wt 144 lb 6.4 oz (65.5  kg)   SpO2 100%   BMI 20.91 kg/m   Physical Exam Constitutional:      General: He is not in acute distress. HENT:     Head: Normocephalic and atraumatic.     Right Ear: External ear normal.     Left Ear: External ear normal.  Eyes:     General: No scleral icterus. Neck:     Thyroid: No thyromegaly.  Cardiovascular:     Rate and Rhythm: Normal rate and regular rhythm.     Heart sounds: Normal heart sounds.  Pulmonary:     Effort: Pulmonary effort is normal.     Breath sounds: Normal breath sounds.  Abdominal:     General: Bowel sounds are normal. There is no distension.     Palpations: Abdomen is soft.     Tenderness: There is no abdominal tenderness. There is no guarding.  Musculoskeletal:     Cervical back: Normal range of motion.  Lymphadenopathy:     Cervical: No cervical adenopathy.  Skin:    General: Skin is warm and dry.     Findings: No rash.  Neurological:     Mental Status: He is alert and oriented to person, place, and time.     Cranial Nerves: No cranial nerve deficit.     Motor: No abnormal muscle tone.  Psychiatric:        Behavior: Behavior normal.     ------------------------------------------------------------------------------------------------------------------------------------------------------------------------------------------------------------------- Assessment and Plan  Well adult exam Well adult Orders Placed This Encounter  Procedures  . COMPLETE METABOLIC PANEL WITH GFR  . CBC  . Lipid Profile  . PSA  Screening: PSA, lipid profile.  Immunizatons: Declines flu vaccine. Shingrix discussed, not sure if he want's to get this yet.  Anticipatory guidance/Risk fact reduction:  Per AVS.    No orders of the defined types were placed in this encounter. *   No follow-ups on file.    This visit occurred during the SARS-CoV-2 public health emergency.  Safety protocols were in place, including screening questions prior to the visit,  additional usage of staff PPE, and extensive cleaning of exam room while observing appropriate contact time as indicated for disinfecting solutions.

## 2020-01-16 NOTE — Assessment & Plan Note (Signed)
Well adult Orders Placed This Encounter  Procedures  . COMPLETE METABOLIC PANEL WITH GFR  . CBC  . Lipid Profile  . PSA  Screening: PSA, lipid profile.  Immunizatons: Declines flu vaccine. Shingrix discussed, not sure if he want's to get this yet.  Anticipatory guidance/Risk fact reduction:  Per AVS.

## 2020-01-17 LAB — LIPID PANEL
Cholesterol: 164 mg/dL (ref <200)
HDL: 58 mg/dL (ref >=40)
LDL Cholesterol (Calc): 90 mg/dL (calc)
Non-HDL Cholesterol (Calc): 106 mg/dL (calc) (ref <130)
Total CHOL/HDL Ratio: 2.8 (calc) (ref <5.0)
Triglycerides: 75 mg/dL (ref <150)

## 2020-01-17 LAB — COMPLETE METABOLIC PANEL WITH GFR
AG Ratio: 1.4 (calc) (ref 1.0–2.5)
ALT: 9 U/L (ref 9–46)
AST: 13 U/L (ref 10–35)
Albumin: 4 g/dL (ref 3.6–5.1)
Alkaline phosphatase (APISO): 72 U/L (ref 35–144)
BUN: 10 mg/dL (ref 7–25)
CO2: 30 mmol/L (ref 20–32)
Calcium: 9.5 mg/dL (ref 8.6–10.3)
Chloride: 104 mmol/L (ref 98–110)
Creat: 0.79 mg/dL (ref 0.70–1.33)
GFR, Est African American: 116 mL/min/{1.73_m2} (ref >=60)
GFR, Est Non African American: 100 mL/min/{1.73_m2} (ref >=60)
Globulin: 2.8 g/dL (calc) (ref 1.9–3.7)
Glucose, Bld: 106 mg/dL — ABNORMAL HIGH (ref 65–99)
Potassium: 4.3 mmol/L (ref 3.5–5.3)
Sodium: 142 mmol/L (ref 135–146)
Total Bilirubin: 0.3 mg/dL (ref 0.2–1.2)
Total Protein: 6.8 g/dL (ref 6.1–8.1)

## 2020-01-17 LAB — CBC
HCT: 42.1 % (ref 38.5–50.0)
Hemoglobin: 14.1 g/dL (ref 13.2–17.1)
MCH: 32.1 pg (ref 27.0–33.0)
MCHC: 33.5 g/dL (ref 32.0–36.0)
MCV: 95.9 fL (ref 80.0–100.0)
MPV: 9.4 fL (ref 7.5–12.5)
Platelets: 488 10*3/uL — ABNORMAL HIGH (ref 140–400)
RBC: 4.39 10*6/uL (ref 4.20–5.80)
RDW: 11.8 % (ref 11.0–15.0)
WBC: 10.6 10*3/uL (ref 3.8–10.8)

## 2020-01-17 LAB — PSA: PSA: 1.26 ng/mL (ref <=4.0)

## 2022-05-01 ENCOUNTER — Ambulatory Visit (INDEPENDENT_AMBULATORY_CARE_PROVIDER_SITE_OTHER): Payer: BC Managed Care – PPO

## 2022-05-01 ENCOUNTER — Ambulatory Visit
Admission: EM | Admit: 2022-05-01 | Discharge: 2022-05-01 | Disposition: A | Discharge status: Home / Self Care | Payer: BC Managed Care – PPO | Attending: Urgent Care | Admitting: Urgent Care

## 2022-05-01 DIAGNOSIS — R3129 Other microscopic hematuria: Secondary | ICD-10-CM | POA: Diagnosis not present

## 2022-05-01 DIAGNOSIS — R350 Frequency of micturition: Secondary | ICD-10-CM | POA: Insufficient documentation

## 2022-05-01 DIAGNOSIS — M546 Pain in thoracic spine: Secondary | ICD-10-CM | POA: Diagnosis not present

## 2022-05-01 DIAGNOSIS — M545 Low back pain, unspecified: Secondary | ICD-10-CM | POA: Diagnosis present

## 2022-05-01 DIAGNOSIS — R109 Unspecified abdominal pain: Secondary | ICD-10-CM | POA: Diagnosis not present

## 2022-05-01 LAB — POCT URINALYSIS DIP (MANUAL ENTRY)
Glucose, UA: NEGATIVE mg/dL
Nitrite, UA: NEGATIVE
Protein Ur, POC: >=300 mg/dL — AB
Spec Grav, UA: >=1.03 — AB (ref 1.010–1.025)
Urobilinogen, UA: 0.2 E.U./dL
pH, UA: 5.5 (ref 5.0–8.0)

## 2022-05-01 MED ORDER — KETOROLAC TROMETHAMINE 10 MG PO TABS: 10.0000 mg | ORAL_TABLET | Freq: Four times a day (QID) | ORAL | 0 refills | Status: AC | PRN

## 2022-05-01 MED ORDER — TAMSULOSIN HCL 0.4 MG PO CAPS: 0.4000 mg | ORAL_CAPSULE | Freq: Every day | ORAL | 0 refills | Status: AC

## 2022-05-01 MED ORDER — KETOROLAC TROMETHAMINE 30 MG/ML IJ SOLN
30.0000 mg | Freq: Once | INTRAMUSCULAR | Status: AC
  Administered 2022-05-01: 30 mg via INTRAMUSCULAR

## 2022-05-01 NOTE — ED Triage Notes (Signed)
Pt c/o urinary frequency x 2 days and dark colored urine. Also started experiencing lower back pain yesterday. No hx of kidney stones or UTIs. Advil prn.

## 2022-05-01 NOTE — Discharge Instructions (Addendum)
Your x-ray does not show evidence of a stone, however this does not completely rule it out. Please use the urine strainer and strain your urine to determine if you pass a kidney stone. Increase your water!! Try to drink eight 16oz bottles daily. Please start taking ketorolac every 6-8 hours with food starting tomorrow, 05/02/2022.  Do not exceed 4 days total of use. Please also start taking the tamsulosin once daily. If you develop worsening flank pain, fever, or frank hematuria, please head to the emergency room. If your urine culture shows evidence of an infection, we will contact you to start an antibiotic.

## 2022-05-01 NOTE — ED Provider Notes (Signed)
[facility]    CSN: [id number] Arrival date & time: 05/01/22  1257      History   Chief Complaint Chief Complaint  Patient presents with   Back Pain    Lower   Urinary Frequency    Dark urine color    HPI Bobby Patterson is a 62 y.o. male.   62 year old male with no known past medical history presents today due to concerns of urinary frequency, abnormal urine color, and right sided back pain.  He states that yesterday he went to the bathroom and felt that his urine was slightly darker than normal.  He states he woke up this morning, and is having urinary frequency.  He denies pain with urination.  He states he is gone to the bathroom at least 15 times so far today.  He denies any history of BPH or prostatitis. Denies hesitancy. No fevers.  He states that today he started having right sided back pain, lower than his ribs.  He denies any heavy lifting or recent injury.  The pain is not reproducible to palpation.  Patient states he drinks anywhere from 500 mL to 1500 mL of water daily, and admits to drinking excessive wine, occasional soda.  He has never had a kidney stone to his knowledge.  He denies any recent strep throat.  Patient also denies any muscle pain or fatigue.  Patient does admit to a history of gout in the past, but denies known elevated uric acid levels.   Back Pain Urinary Frequency    Past Medical History:  Diagnosis Date   Gout     Patient Active Problem List   Diagnosis Date Noted   Well adult exam 01/16/2020   Diverticulosis 04/18/2016   Hx of adenomatous colonic polyps 04/18/2016   Tobacco use disorder, mild, abuse 03/26/2016   Constipation 03/26/2016    Past Surgical History:  Procedure Laterality Date   HERNIA REPAIR     WISDOM TOOTH EXTRACTION         Home Medications    Prior to Admission medications   Medication Sig Start Date End Date Taking? Authorizing Provider  ketorolac (TORADOL) 10 MG tablet Take 1 tablet (10 mg total)  by mouth every 6 (six) hours as needed for up to 4 days. 05/01/22 05/05/22 Yes Cathy Ropp L, PA  tamsulosin (FLOMAX) 0.4 MG CAPS capsule Take 1 capsule (0.4 mg total) by mouth daily after supper for 10 days. 05/01/22 05/11/22 Yes Jabrea Kallstrom, Sherren Kerns, PA    Family History Family History  Problem Relation Age of Onset   Cancer Mother        ovarian   Cancer Father        colon   Stroke Father    Diabetes Father    Diabetes Paternal Grandfather     Social History Social History   Tobacco Use   Smoking status: Former    Packs/day: 0.25    Years: 10.00    Total pack years: 2.50    Types: Cigarettes    Quit date: 12/16/2018    Years since quitting: 3.3   Smokeless tobacco: Never  Vaping Use   Vaping Use: Never used  Substance Use Topics   Alcohol use: Yes    Alcohol/week: 3.0 standard drinks of alcohol    Types: 3 Standard drinks or equivalent per week   Drug use: Never     Allergies   Patient has no known allergies.   Review of Systems Review of Systems  Genitourinary:  Positive for frequency.  Musculoskeletal:  Positive for back pain.     Physical Exam Triage Vital Signs ED Triage Vitals  Enc Vitals Group     BP 05/01/22 1307 (!) 168/93     Pulse Rate 05/01/22 1307 65     Resp 05/01/22 1307 18     Temp 05/01/22 1307 97.8 F (36.6 C)     Temp Source 05/01/22 1307 Oral     SpO2 05/01/22 1307 99 %     Weight --      Height --      Head Circumference --      Peak Flow --      Pain Score 05/01/22 1308 3     Pain Loc --      Pain Edu? --      Excl. in Person? --    No data found.  Updated Vital Signs BP (!) 168/93 (BP Location: Right Arm)   Pulse 65   Temp 97.8 F (36.6 C) (Oral)   Resp 18   SpO2 99%   Visual Acuity Right Eye Distance:   Left Eye Distance:   Bilateral Distance:    Right Eye Near:   Left Eye Near:    Bilateral Near:     Physical Exam Vitals and nursing note reviewed.  Constitutional:      General: He is not in acute distress.     Appearance: Normal appearance. He is normal weight. He is not ill-appearing, toxic-appearing or diaphoretic.  HENT:     Head: Normocephalic and atraumatic.     Mouth/Throat:     Mouth: Mucous membranes are moist.     Pharynx: Oropharynx is clear. No oropharyngeal exudate.  Eyes:     General:        Right eye: No discharge.        Left eye: No discharge.     Extraocular Movements: Extraocular movements intact.     Conjunctiva/sclera: Conjunctivae normal.     Pupils: Pupils are equal, round, and reactive to light.  Cardiovascular:     Rate and Rhythm: Normal rate.  Pulmonary:     Effort: Pulmonary effort is normal. No respiratory distress.  Abdominal:     General: Abdomen is flat. Bowel sounds are normal. There is no distension.     Tenderness: There is no abdominal tenderness. There is no right CVA tenderness, guarding or rebound.  Musculoskeletal:        General: No swelling or tenderness.  Skin:    General: Skin is warm and dry.     Findings: No bruising, erythema or rash.  Neurological:     General: No focal deficit present.     Mental Status: He is alert and oriented to person, place, and time.     Sensory: No sensory deficit.     Motor: No weakness.      UC Treatments / Results  Labs (all labs ordered are listed, but only abnormal results are displayed) Labs Reviewed  POCT URINALYSIS DIP (MANUAL ENTRY) - Abnormal; Notable for the following components:      Result Value   Color, UA brown (*)    Clarity, UA cloudy (*)    Bilirubin, UA small (*)    Ketones, POC UA small (15) (*)    Spec Grav, UA >=1.030 (*)    Blood, UA large (*)    Protein Ur, POC >=300 (*)    Leukocytes, UA Trace (*)    All other components within  normal limits  URINE CULTURE    EKG   Radiology DG Abd 1 View  Result Date: 05/01/2022 CLINICAL DATA:  Right flank/back pain EXAM: ABDOMEN - 1 VIEW COMPARISON:  None Available. FINDINGS: The bowel gas pattern is normal. No radio-opaque calculi  or other significant radiographic abnormality are seen. IMPRESSION: Negative. Electronically Signed   By: Yetta Glassman M.D.   On: 05/01/2022 13:48    Procedures Procedures (including critical care time)  Medications Ordered in UC Medications  ketorolac (TORADOL) 30 MG/ML injection 30 mg (30 mg Intramuscular Given 05/01/22 1414)    Initial Impression / Assessment and Plan / UC Course  I have reviewed the triage vital signs and the nursing notes.  Pertinent labs & imaging results that were available during my care of the patient were reviewed by me and considered in my medical decision making (see chart for details).     Urinary frequency - no signs of infection on UA in office. Given pt's symptoms, I am most concerned for kidney stone. KUB does not show evidence of retained stone, I question if pt passed it already. Ddx: prostatitis, pyelonephritis, glomerulonephritis, rhabdomyelitis, although all these are less likely given pt's presentation. Will send urine out for culture to ensure there is no bacterial growth. Start tamsulosin as prescribed Microscopic hematuria - gross hematuria not noted, however RBC noted on dip. Again, suspect clinically nephro or ureterolithiasis. Will do combo of ketorolac and tamsulosin. Pt given a strainer and recommended he strain his urine. INCREASE WATER intake, cut back on non-water beverages.  R sided back pain - not reproducible. KUB WNL. Encouraged pt to go to ER if fever, worsening flank pain, or visible hematuria.   Final Clinical Impressions(s) / UC Diagnoses   Final diagnoses:  Urinary frequency  Other microscopic hematuria  Acute right-sided thoracic back pain     Discharge Instructions      Your x-ray does not show evidence of a stone, however this does not completely rule it out. Please use the urine strainer and strain your urine to determine if you pass a kidney stone. Increase your water!! Try to drink eight 16oz bottles  daily. Please start taking ketorolac every 6-8 hours with food starting tomorrow, 05/02/2022.  Do not exceed 4 days total of use. Please also start taking the tamsulosin once daily. If you develop worsening flank pain, fever, or frank hematuria, please head to the emergency room. If your urine culture shows evidence of an infection, we will contact you to start an antibiotic.    ED Prescriptions     Medication Sig Dispense Auth. Provider   ketorolac (TORADOL) 10 MG tablet Take 1 tablet (10 mg total) by mouth every 6 (six) hours as needed for up to 4 days. 16 tablet Carlissa Pesola L, PA   tamsulosin (FLOMAX) 0.4 MG CAPS capsule Take 1 capsule (0.4 mg total) by mouth daily after supper for 10 days. 10 capsule Bobby Patterson L, PA      PDMP not reviewed this encounter.   Chaney Malling, [place] 05/01/22 1453

## 2022-05-02 LAB — URINE CULTURE: Culture: NO GROWTH
